# Patient Record
Sex: Female | Born: 1985 | Race: White | Hispanic: No | Marital: Married | Smoking: Former smoker
Health system: Southern US, Community
[De-identification: ages and names within clinical notes are randomized; demographics above are authoritative.]

## PROBLEM LIST (undated history)

## (undated) DIAGNOSIS — N750 Cyst of Bartholin's gland: Secondary | ICD-10-CM

## (undated) HISTORY — DX: Cyst of Bartholin's gland: N75.0

---

## 2008-04-20 ENCOUNTER — Emergency Department (HOSPITAL_COMMUNITY): Admission: EM | Admit: 2008-04-20 | Discharge: 2008-04-20 | Payer: Self-pay | Admitting: Emergency Medicine

## 2009-02-03 ENCOUNTER — Emergency Department (HOSPITAL_COMMUNITY): Admission: EM | Admit: 2009-02-03 | Discharge: 2009-02-03 | Payer: Self-pay | Admitting: Family Medicine

## 2009-09-21 ENCOUNTER — Emergency Department (HOSPITAL_COMMUNITY): Admission: EM | Admit: 2009-09-21 | Discharge: 2009-09-21 | Payer: Self-pay | Admitting: Emergency Medicine

## 2010-03-13 IMAGING — CR DG WRIST COMPLETE 3+V*L*
1 series · 1 of 1 positions shown · non-contrast
Comparison: None.

CLINICAL DATA: Fall, trauma 2 weeks ago snowboarding

LEFT WRIST - COMPLETE 3+ VIEW

[view not recorded]
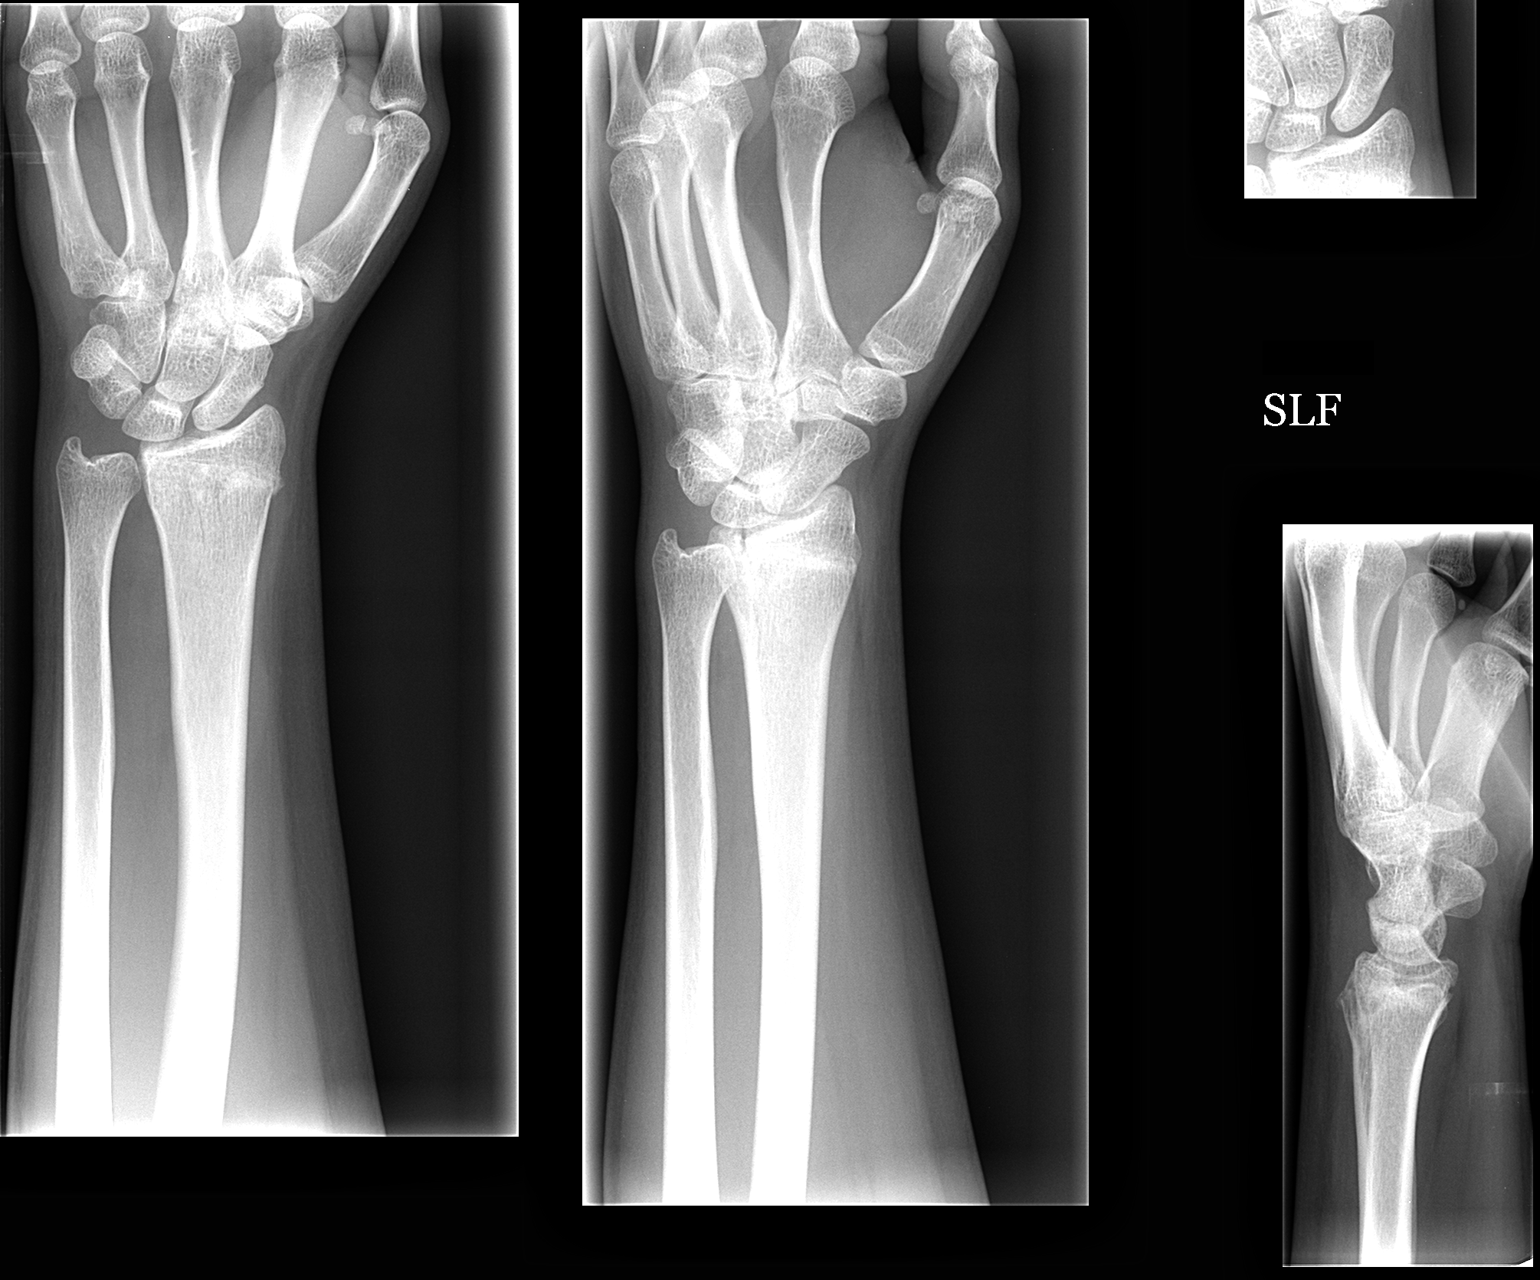

[1 of 1 positions shown; findings below may reference images not displayed]

FINDINGS: A nondisplaced distal radius fracture is noted which
extends to involve the articular surface.  On the frontal view,
there is evidence of early callus formation.  Intact distal ulna
and carpal bones.  On the lateral view, there is mild soft tissue
swelling.
IMPRESSION: Nondisplaced distal radius fracture.

## 2010-05-30 ENCOUNTER — Ambulatory Visit (INDEPENDENT_AMBULATORY_CARE_PROVIDER_SITE_OTHER): Payer: 59 | Admitting: Gynecology

## 2010-05-30 DIAGNOSIS — N750 Cyst of Bartholin's gland: Secondary | ICD-10-CM

## 2017-02-17 ENCOUNTER — Encounter (HOSPITAL_COMMUNITY): Payer: Self-pay | Admitting: Emergency Medicine

## 2017-02-17 ENCOUNTER — Emergency Department (HOSPITAL_COMMUNITY)
Admission: EM | Admit: 2017-02-17 | Discharge: 2017-02-17 | Disposition: A | Discharge status: Home / Self Care | Payer: Self-pay | Attending: Emergency Medicine | Admitting: Emergency Medicine

## 2017-02-17 ENCOUNTER — Other Ambulatory Visit: Payer: Self-pay

## 2017-02-17 DIAGNOSIS — R0981 Nasal congestion: Secondary | ICD-10-CM | POA: Insufficient documentation

## 2017-02-17 DIAGNOSIS — Z5321 Procedure and treatment not carried out due to patient leaving prior to being seen by health care provider: Secondary | ICD-10-CM | POA: Insufficient documentation

## 2017-02-17 NOTE — ED Notes (Signed)
Called patient to go to a room x 3 and no answer. 

## 2017-02-17 NOTE — ED Notes (Signed)
Bed: WTR5 Expected date:  Expected time:  Means of arrival:  Comments: 

## 2017-02-17 NOTE — ED Triage Notes (Addendum)
Patient complaining of a cough and  Nasal congestion. Patient states she has been sick since last Sunday. She got better and has worked three days. She is getting sick again. Patient states her chest is hurting due to all the coughing she is doing.

## 2017-02-25 NOTE — L&D Delivery Note (Signed)
Delivery Note At 7:30 PM a viable female was delivered via Vaginal, Spontaneous (Presentation: vtx; ROP ).  APGAR: 9, 9; weight pending.   Placenta status: spontaneous, intact.  Cord:  with the following complications: none.   Anesthesia: Epidural  Episiotomy: None Lacerations: 1st degree Suture Repair: 3.0 vicryl rapide Est. Blood Loss (mL):  250  Mom to postpartum.  Baby to Couplet care / Skin to Skin.  Will do circumcision in the hospital  Leighton Roachodd D Idali Lafever 01/16/2018, 7:49 PM

## 2017-04-09 ENCOUNTER — Telehealth: Payer: Self-pay | Admitting: *Deleted

## 2017-04-09 ENCOUNTER — Encounter: Payer: Self-pay | Admitting: Women's Health

## 2017-04-09 ENCOUNTER — Ambulatory Visit: Payer: 59 | Admitting: Women's Health

## 2017-04-09 VITALS — BP 118/78 | Ht 64.0 in | Wt 149.0 lb

## 2017-04-09 DIAGNOSIS — N644 Mastodynia: Secondary | ICD-10-CM

## 2017-04-09 NOTE — Telephone Encounter (Signed)
-----   Message from Harrington ChallengerNancy J Young, NP sent at 04/09/2017 12:46 PM EST ----- Needs diagnostic left breast mammogram soreness and tenderness outer aspect for 2 weeks a change, no nipple discharge no injury change in routine or exercise. Tuesdays are best day

## 2017-04-09 NOTE — Telephone Encounter (Signed)
Pt scheduled on 04/15/17 @ 12:40pm pt informed.

## 2017-04-09 NOTE — Patient Instructions (Signed)

## 2017-04-09 NOTE — Progress Notes (Signed)
32 year old MWF G1 P1 with complaint of left breast pain and questionable lump in left outer breast for the past 2 weeks. Has a 33-1/33-year-old daughter who she did breast-feed for 3 years. Denies nipple discharge, erythema, injury, change in routine. Does drink several cups of coffee daily but no change. No family history of breast cancer. Regular monthly cycle/condoms. 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 Recently moved back to the area.  Exam: Appears well. Breast exam and sitting and lying position without visible erythema, retractions, dimpling, palpable nodules, fibroglandular tissue outer aspect of left breast, no nipple discharge, entire outer aspect of left breast tender to touch.  Left breast mastodynia  Plan: We'll schedule diagnostic left breast mammogram. Encouraged to decrease caffeine intake, vitamin E twice daily, keep scheduled annual exam appointment.

## 2017-04-15 ENCOUNTER — Ambulatory Visit
Admission: RE | Admit: 2017-04-15 | Discharge: 2017-04-15 | Disposition: A | Discharge status: Home / Self Care | Payer: 59 | Source: Ambulatory Visit | Attending: Women's Health | Admitting: Women's Health

## 2017-04-15 DIAGNOSIS — N644 Mastodynia: Secondary | ICD-10-CM

## 2017-05-07 ENCOUNTER — Encounter: Payer: Self-pay | Admitting: Women's Health

## 2017-06-26 LAB — OB RESULTS CONSOLE HEPATITIS B SURFACE ANTIGEN: Hepatitis B Surface Ag: NEGATIVE

## 2017-06-26 LAB — OB RESULTS CONSOLE RPR: RPR: NONREACTIVE

## 2017-06-26 LAB — OB RESULTS CONSOLE HIV ANTIBODY (ROUTINE TESTING): HIV: NONREACTIVE

## 2017-06-26 LAB — OB RESULTS CONSOLE GC/CHLAMYDIA
Chlamydia: NEGATIVE
GC PROBE AMP, GENITAL: NEGATIVE

## 2017-06-26 LAB — OB RESULTS CONSOLE ABO/RH: RH Type: POSITIVE

## 2017-06-26 LAB — OB RESULTS CONSOLE ANTIBODY SCREEN: ANTIBODY SCREEN: NEGATIVE

## 2017-06-26 LAB — OB RESULTS CONSOLE RUBELLA ANTIBODY, IGM: Rubella: IMMUNE

## 2017-07-15 ENCOUNTER — Encounter: Payer: Self-pay | Admitting: Women's Health

## 2017-12-24 LAB — OB RESULTS CONSOLE GBS: STREP GROUP B AG: POSITIVE

## 2018-01-07 ENCOUNTER — Telehealth (HOSPITAL_COMMUNITY): Payer: Self-pay | Admitting: *Deleted

## 2018-01-07 ENCOUNTER — Encounter (HOSPITAL_COMMUNITY): Payer: Self-pay | Admitting: *Deleted

## 2018-01-07 NOTE — Telephone Encounter (Signed)
Preadmission screen  

## 2018-01-16 ENCOUNTER — Inpatient Hospital Stay (HOSPITAL_COMMUNITY)
Admission: RE | Admit: 2018-01-16 | Discharge: 2018-01-18 | DRG: 806 | Disposition: A | Discharge status: Home / Self Care | Payer: 59 | Attending: Obstetrics and Gynecology | Admitting: Obstetrics and Gynecology

## 2018-01-16 ENCOUNTER — Inpatient Hospital Stay (HOSPITAL_COMMUNITY): Payer: 59 | Admitting: Anesthesiology

## 2018-01-16 ENCOUNTER — Encounter (HOSPITAL_COMMUNITY): Payer: Self-pay

## 2018-01-16 DIAGNOSIS — O9832 Other infections with a predominantly sexual mode of transmission complicating childbirth: Secondary | ICD-10-CM | POA: Diagnosis present

## 2018-01-16 DIAGNOSIS — A6000 Herpesviral infection of urogenital system, unspecified: Secondary | ICD-10-CM | POA: Diagnosis present

## 2018-01-16 DIAGNOSIS — Z3A39 39 weeks gestation of pregnancy: Secondary | ICD-10-CM | POA: Diagnosis not present

## 2018-01-16 DIAGNOSIS — Z3483 Encounter for supervision of other normal pregnancy, third trimester: Secondary | ICD-10-CM | POA: Diagnosis present

## 2018-01-16 DIAGNOSIS — Z87891 Personal history of nicotine dependence: Secondary | ICD-10-CM

## 2018-01-16 DIAGNOSIS — O99824 Streptococcus B carrier state complicating childbirth: Principal | ICD-10-CM | POA: Diagnosis present

## 2018-01-16 LAB — TYPE AND SCREEN
ABO/RH(D): A POS
ANTIBODY SCREEN: NEGATIVE

## 2018-01-16 LAB — CBC
HEMATOCRIT: 37.8 % (ref 36.0–46.0)
Hemoglobin: 12.8 g/dL (ref 12.0–15.0)
MCH: 30.5 pg (ref 26.0–34.0)
MCHC: 33.9 g/dL (ref 30.0–36.0)
MCV: 90.2 fL (ref 80.0–100.0)
NRBC: 0 % (ref 0.0–0.2)
PLATELETS: 179 10*3/uL (ref 150–400)
RBC: 4.19 MIL/uL (ref 3.87–5.11)
RDW: 12.5 % (ref 11.5–15.5)
WBC: 8.2 10*3/uL (ref 4.0–10.5)

## 2018-01-16 LAB — ABO/RH: ABO/RH(D): A POS

## 2018-01-16 LAB — RPR: RPR: NONREACTIVE

## 2018-01-16 MED ORDER — MEASLES, MUMPS & RUBELLA VAC IJ SOLR
0.5000 mL | Freq: Once | INTRAMUSCULAR | Status: DC
  Filled 2018-01-16: qty 0.5

## 2018-01-16 MED ORDER — OXYCODONE-ACETAMINOPHEN 5-325 MG PO TABS: 2.0000 | ORAL_TABLET | ORAL | Status: DC | PRN

## 2018-01-16 MED ORDER — ZOLPIDEM TARTRATE 5 MG PO TABS: 5.0000 mg | ORAL_TABLET | Freq: Every evening | ORAL | Status: DC | PRN

## 2018-01-16 MED ORDER — SENNOSIDES-DOCUSATE SODIUM 8.6-50 MG PO TABS
2.0000 | ORAL_TABLET | ORAL | Status: DC
  Administered 2018-01-16 – 2018-01-18 (×2): 2 via ORAL
  Filled 2018-01-16 (×2): qty 2

## 2018-01-16 MED ORDER — ONDANSETRON HCL 4 MG/2ML IJ SOLN: 4.0000 mg | Freq: Four times a day (QID) | INTRAMUSCULAR | Status: DC | PRN

## 2018-01-16 MED ORDER — MAGNESIUM HYDROXIDE 400 MG/5ML PO SUSP: 30.0000 mL | ORAL | Status: DC | PRN

## 2018-01-16 MED ORDER — COCONUT OIL OIL: 1.0000 "application " | TOPICAL_OIL | Status: DC | PRN

## 2018-01-16 MED ORDER — METHYLERGONOVINE MALEATE 0.2 MG/ML IJ SOLN: 0.2000 mg | INTRAMUSCULAR | Status: DC | PRN

## 2018-01-16 MED ORDER — OXYCODONE HCL 5 MG PO TABS: 5.0000 mg | ORAL_TABLET | ORAL | Status: DC | PRN

## 2018-01-16 MED ORDER — PENICILLIN G 3 MILLION UNITS IVPB - SIMPLE MED
3.0000 10*6.[IU] | INTRAVENOUS | Status: DC
  Administered 2018-01-16: 3 10*6.[IU] via INTRAVENOUS
  Filled 2018-01-16 (×3): qty 100

## 2018-01-16 MED ORDER — SIMETHICONE 80 MG PO CHEW: 80.0000 mg | CHEWABLE_TABLET | ORAL | Status: DC | PRN

## 2018-01-16 MED ORDER — BENZOCAINE-MENTHOL 20-0.5 % EX AERO
1.0000 "application " | INHALATION_SPRAY | CUTANEOUS | Status: DC | PRN
  Filled 2018-01-16: qty 56

## 2018-01-16 MED ORDER — PRENATAL MULTIVITAMIN CH
1.0000 | ORAL_TABLET | Freq: Every day | ORAL | Status: DC
  Administered 2018-01-17 – 2018-01-18 (×2): 1 via ORAL
  Filled 2018-01-16 (×2): qty 1

## 2018-01-16 MED ORDER — EPHEDRINE 5 MG/ML INJ: 10.0000 mg | INTRAVENOUS | Status: DC | PRN

## 2018-01-16 MED ORDER — EPHEDRINE 5 MG/ML INJ
10.0000 mg | INTRAVENOUS | Status: DC | PRN
  Filled 2018-01-16: qty 2

## 2018-01-16 MED ORDER — LIDOCAINE HCL (PF) 1 % IJ SOLN
30.0000 mL | INTRAMUSCULAR | Status: DC | PRN
  Filled 2018-01-16: qty 30

## 2018-01-16 MED ORDER — TERBUTALINE SULFATE 1 MG/ML IJ SOLN
0.2500 mg | Freq: Once | INTRAMUSCULAR | Status: DC | PRN
  Filled 2018-01-16: qty 1

## 2018-01-16 MED ORDER — ONDANSETRON HCL 4 MG PO TABS: 4.0000 mg | ORAL_TABLET | ORAL | Status: DC | PRN

## 2018-01-16 MED ORDER — ACETAMINOPHEN 325 MG PO TABS: 650.0000 mg | ORAL_TABLET | ORAL | Status: DC | PRN

## 2018-01-16 MED ORDER — PHENYLEPHRINE 40 MCG/ML (10ML) SYRINGE FOR IV PUSH (FOR BLOOD PRESSURE SUPPORT)
80.0000 ug | PREFILLED_SYRINGE | INTRAVENOUS | Status: DC | PRN
  Filled 2018-01-16: qty 5

## 2018-01-16 MED ORDER — LACTATED RINGERS IV SOLN: 500.0000 mL | Freq: Once | INTRAVENOUS | Status: DC

## 2018-01-16 MED ORDER — SOD CITRATE-CITRIC ACID 500-334 MG/5ML PO SOLN: 30.0000 mL | ORAL | Status: DC | PRN

## 2018-01-16 MED ORDER — FENTANYL 2.5 MCG/ML BUPIVACAINE 1/10 % EPIDURAL INFUSION (WH - ANES)
14.0000 mL/h | INTRAMUSCULAR | Status: DC | PRN
  Administered 2018-01-16: 14 mL/h via EPIDURAL
  Filled 2018-01-16 (×2): qty 100

## 2018-01-16 MED ORDER — LACTATED RINGERS IV SOLN: 500.0000 mL | INTRAVENOUS | Status: DC | PRN

## 2018-01-16 MED ORDER — SODIUM CHLORIDE 0.9 % IV SOLN
5.0000 10*6.[IU] | Freq: Once | INTRAVENOUS | Status: AC
  Administered 2018-01-16: 5 10*6.[IU] via INTRAVENOUS
  Filled 2018-01-16: qty 5

## 2018-01-16 MED ORDER — OXYTOCIN BOLUS FROM INFUSION
500.0000 mL | Freq: Once | INTRAVENOUS | Status: AC
  Administered 2018-01-16: 500 mL via INTRAVENOUS

## 2018-01-16 MED ORDER — ONDANSETRON HCL 4 MG/2ML IJ SOLN: 4.0000 mg | INTRAMUSCULAR | Status: DC | PRN

## 2018-01-16 MED ORDER — IBUPROFEN 600 MG PO TABS
600.0000 mg | ORAL_TABLET | Freq: Four times a day (QID) | ORAL | Status: DC
  Administered 2018-01-16 – 2018-01-18 (×7): 600 mg via ORAL
  Filled 2018-01-16 (×7): qty 1

## 2018-01-16 MED ORDER — LIDOCAINE HCL (PF) 1 % IJ SOLN
INTRAMUSCULAR | Status: DC | PRN
  Administered 2018-01-16: 5 mL via EPIDURAL

## 2018-01-16 MED ORDER — OXYTOCIN 40 UNITS IN LACTATED RINGERS INFUSION - SIMPLE MED: 1.0000 m[IU]/min | INTRAVENOUS | Status: DC

## 2018-01-16 MED ORDER — LACTATED RINGERS IV SOLN
INTRAVENOUS | Status: DC
  Administered 2018-01-16: 1000 mL via INTRAVENOUS
  Administered 2018-01-16: 08:00:00 via INTRAVENOUS

## 2018-01-16 MED ORDER — OXYTOCIN 40 UNITS IN LACTATED RINGERS INFUSION - SIMPLE MED
1.0000 m[IU]/min | INTRAVENOUS | Status: DC
  Administered 2018-01-16: 1 m[IU]/min via INTRAVENOUS
  Filled 2018-01-16: qty 1000

## 2018-01-16 MED ORDER — BUTORPHANOL TARTRATE 1 MG/ML IJ SOLN: 1.0000 mg | INTRAMUSCULAR | Status: DC | PRN

## 2018-01-16 MED ORDER — DIPHENHYDRAMINE HCL 50 MG/ML IJ SOLN: 12.5000 mg | INTRAMUSCULAR | Status: DC | PRN

## 2018-01-16 MED ORDER — PHENYLEPHRINE 40 MCG/ML (10ML) SYRINGE FOR IV PUSH (FOR BLOOD PRESSURE SUPPORT)
80.0000 ug | PREFILLED_SYRINGE | INTRAVENOUS | Status: DC | PRN
  Filled 2018-01-16: qty 5
  Filled 2018-01-16: qty 10

## 2018-01-16 MED ORDER — OXYTOCIN 40 UNITS IN LACTATED RINGERS INFUSION - SIMPLE MED: 2.5000 [IU]/h | INTRAVENOUS | Status: DC

## 2018-01-16 MED ORDER — ACETAMINOPHEN 325 MG PO TABS
650.0000 mg | ORAL_TABLET | ORAL | Status: DC | PRN
  Administered 2018-01-17: 650 mg via ORAL
  Filled 2018-01-16: qty 2

## 2018-01-16 MED ORDER — METHYLERGONOVINE MALEATE 0.2 MG PO TABS: 0.2000 mg | ORAL_TABLET | ORAL | Status: DC | PRN

## 2018-01-16 MED ORDER — OXYCODONE HCL 5 MG PO TABS: 10.0000 mg | ORAL_TABLET | ORAL | Status: DC | PRN

## 2018-01-16 MED ORDER — DIPHENHYDRAMINE HCL 25 MG PO CAPS: 25.0000 mg | ORAL_CAPSULE | Freq: Four times a day (QID) | ORAL | Status: DC | PRN

## 2018-01-16 MED ORDER — WITCH HAZEL-GLYCERIN EX PADS: 1.0000 "application " | MEDICATED_PAD | CUTANEOUS | Status: DC | PRN

## 2018-01-16 MED ORDER — PHENYLEPHRINE 40 MCG/ML (10ML) SYRINGE FOR IV PUSH (FOR BLOOD PRESSURE SUPPORT): 80.0000 ug | PREFILLED_SYRINGE | INTRAVENOUS | Status: DC | PRN

## 2018-01-16 MED ORDER — DIBUCAINE 1 % RE OINT: 1.0000 "application " | TOPICAL_OINTMENT | RECTAL | Status: DC | PRN

## 2018-01-16 MED ORDER — OXYCODONE-ACETAMINOPHEN 5-325 MG PO TABS: 1.0000 | ORAL_TABLET | ORAL | Status: DC | PRN

## 2018-01-16 MED ORDER — TETANUS-DIPHTH-ACELL PERTUSSIS 5-2.5-18.5 LF-MCG/0.5 IM SUSP: 0.5000 mL | Freq: Once | INTRAMUSCULAR | Status: DC

## 2018-01-16 NOTE — Progress Notes (Signed)
Charted on wrong pt.

## 2018-01-16 NOTE — Progress Notes (Signed)
Comfortable with epidural Afeb, VSS FHT- 110-120, Cat I VE-2/70/-2, vtx, AROM clear Continue PCN and pitocin, monitor progress, anticipate SVD

## 2018-01-16 NOTE — Progress Notes (Signed)
Comfortable with epidural Afeb, VSS FHT- 110-120, some early decels, Cat I VE-6 cm per RN Continue PCN and pitocin, anticipate SVD

## 2018-01-16 NOTE — Progress Notes (Signed)
NICU called

## 2018-01-16 NOTE — H&P (Signed)
Alyssa Gross is a 32 y.o. female, G2 P1001, EGA 39+ [facility]els [facility] presenting for elective induction.  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  OB History    Gravida  2   Para  1   Term      Preterm      AB  0   Living  1     SAB      TAB      Ectopic  0   Multiple      Live Births  1          Past Medical History:  Diagnosis Date  . Bartholin's duct cyst    abcess treated with word catheter   History reviewed. No pertinent surgical history. Family History: family history includes Alzheimer's disease in her paternal grandfather; Asthma in her mother; Breast cancer (age of onset: 4335) in her mother; Esophageal cancer in her maternal grandfather; Hypertension in her father; Lung cancer in her maternal grandmother and paternal grandmother. Social History:  reports that she quit smoking about 10 months ago. She has never used smokeless tobacco. She reports that she drinks alcohol. She reports that she does not use drugs.     Maternal Diabetes: No Genetic Screening: Declined Maternal Ultrasounds/Referrals: Normal Fetal Ultrasounds or other Referrals:  None Maternal Substance Abuse:  No Significant Maternal Medications:  None Significant Maternal Lab Results:  Lab values include: Group B Strep positive Other Comments:  None  Review of Systems  Respiratory: Negative.   Cardiovascular: Negative.    Maternal Medical History:  Fetal activity: Perceived fetal activity is normal.    Prenatal complications: no prenatal complications Prenatal Complications - Diabetes: none.    Dilation: 1.5 Effacement (%): 50 Station: -2 Exam by:: Alyssa Gross Blood pressure 117/78, pulse (!) 104, temperature 98.1 F (36.7 C), temperature source Oral, height 5\' 4"  (1.626 m), weight 81.6 kg, last menstrual period 03/26/2017. Maternal Exam:  Uterine Assessment: Contraction strength is mild.  Contraction frequency is irregular.    Abdomen: Patient reports no abdominal tenderness. Estimated fetal weight is 7.5 lbs.   Fetal presentation: vertex  Introitus: Normal vulva. Normal vagina.  Amniotic fluid character: not assessed.  Pelvis: adequate for delivery.   Cervix: Cervix evaluated by digital exam.     Fetal Exam Fetal Monitor Review: Mode: ultrasound.   Baseline rate: 130.  Variability: moderate (6-25 bpm).   Pattern: accelerations present and no decelerations.    Fetal State Assessment: Category I - tracings are normal.     Physical Exam  Prenatal labs: ABO, Rh: A/Positive/-- (05/02 0000) Antibody: Negative (05/02 0000) Rubella: Immune (05/02 0000) RPR: Nonreactive (05/02 0000)  HBsAg: Negative (05/02 0000)  HIV: Non-reactive (05/02 0000)  GBS: Positive (10/30 0000)   Assessment/Plan: IUP at 39+ weeks for elective induction, h/o rapid labor, +GBS.  Will start PCN, then pitocin, epidural when having ctx, monitor progress, anticipate SVD   Leighton Roachodd D Helmut Hennon 01/16/2018, 8:06 AM

## 2018-01-16 NOTE — Anesthesia Pain Management Evaluation Note (Signed)
  CRNA Pain Management Visit Note  Patient: Alyssa Gross, 32 y.o., female  "Hello [facility] am a [facility] the [facility]. We have an anesthesia team available at all times to provide care throughout the hospital, including epidural management and anesthesia for C-section. I don't know your plan for the delivery whether it a natural birth, water birth, IV sedation, nitrous supplementation, doula or epidural, but we want to meet your pain goals."   1.Was your pain managed to your expectations on prior hospitalizations?   Yes   2.What is your expectation for pain management during this hospitalization?     Epidural  3.How can we help you reach that goal? epidural  Record the patient's initial score and the patient's pain goal.   Pain: 3  Pain Goal: 5 [facility] wants you to be able to say your pain was always managed very well.  [name] 01/16/2018

## 2018-01-16 NOTE — Anesthesia Procedure Notes (Signed)
Epidural Patient location during procedure: OB Start time: 01/16/2018 11:04 AM End time: 01/16/2018 11:22 AM  Staffing Anesthesiologist: Trevor IhaHouser, Kalene Cutler A, MD Performed: anesthesiologist   Preanesthetic Checklist Completed: patient identified, site marked, surgical consent, pre-op evaluation, timeout performed, IV checked, risks and benefits discussed and monitors and equipment checked  Epidural Patient position: sitting Prep: site prepped and draped and DuraPrep Patient monitoring: continuous pulse ox and blood pressure Approach: midline Location: L3-L4 Injection technique: LOR air  Needle:  Needle type: Tuohy  Needle gauge: 17 G Needle length: 9 cm and 9 Needle insertion depth: 8 cm Catheter type: closed end flexible Catheter size: 19 Gauge Catheter at skin depth: 13 cm Test dose: negative  Assessment Events: blood not aspirated, injection not painful, no injection resistance, negative IV test and no paresthesia  Additional Notes 1 attempts. Pt tolerated procedure well

## 2018-01-16 NOTE — Anesthesia Preprocedure Evaluation (Addendum)
Anesthesia Evaluation  Patient identified by MRN, date of birth, ID band Patient awake    Reviewed: Allergy & Precautions, H&P , NPO status , Patient's Chart, lab work & pertinent test results  Airway Mallampati: II  TM Distance: >3 FB Neck ROM: Full    Dental no notable dental hx. (+) Teeth Intact   Pulmonary former smoker,    Pulmonary exam normal breath sounds clear to auscultation       Cardiovascular negative cardio ROS Normal cardiovascular exam Rhythm:Regular Rate:Normal     Neuro/Psych negative neurological ROS  negative psych ROS   GI/Hepatic negative GI ROS, Neg liver ROS,   Endo/Other  negative endocrine ROS  Renal/GU negative Renal ROS     Musculoskeletal negative musculoskeletal ROS (+)   Abdominal   Peds  Hematology negative hematology ROS (+)   Anesthesia Other Findings   Reproductive/Obstetrics (+) Pregnancy                             Lab Results  Component Value Date   WBC 8.2 01/16/2018   HGB 12.8 01/16/2018   HCT 37.8 01/16/2018   MCV 90.2 01/16/2018   PLT 179 01/16/2018    Anesthesia Physical Anesthesia Plan  ASA: II  Anesthesia Plan: Epidural   Post-op Pain Management:    Induction:   PONV Risk Score and Plan:   Airway Management Planned:   Additional Equipment:   Intra-op Plan:   Post-operative Plan:   Informed Consent: I have reviewed the patients History and Physical, chart, labs and discussed the procedure including the risks, benefits and alternatives for the proposed anesthesia with the patient or authorized representative who has indicated his/her understanding and acceptance.   Dental advisory given  Plan Discussed with: CRNA  Anesthesia Plan Comments:         Anesthesia Quick Evaluation

## 2018-01-16 NOTE — Progress Notes (Signed)
This note also relates to the following rows which could not be included: BP - Cannot attach notes to unvalidated device data Pulse Rate - Cannot attach notes to unvalidated device data  IV Line leaking. Replaced

## 2018-01-17 ENCOUNTER — Other Ambulatory Visit: Payer: Self-pay

## 2018-01-17 MED ORDER — IBUPROFEN 600 MG PO TABS: 600.0000 mg | ORAL_TABLET | Freq: Four times a day (QID) | ORAL | 0 refills | Status: DC

## 2018-01-17 NOTE — Discharge Instructions (Signed)
As per discharge pamphlet °

## 2018-01-17 NOTE — Progress Notes (Signed)
PPD #1 No problems, wants to go home Afeb, VSS Fundus firm, NT at U-1 Continue routine postpartum care, d/c home this pm unless problems today

## 2018-01-17 NOTE — Progress Notes (Signed)
MOB was referred for history of depression/anxiety. * Referral screened out by Clinical Social Worker because none of the following criteria appear to apply: ~ History of anxiety/depression during this pregnancy, or of post-partum depression following prior delivery. ~ Diagnosis of anxiety and/or depression within last 3 years OR * MOB's symptoms currently being treated with medication and/or therapy. MOB has an active Rx for Zoloft. No concerns noted in OB records.   Please contact the Clinical Social Worker if needs arise, by MOB request, or if MOB scores greater than 9/yes to question 10 on Edinburgh Postpartum Depression Screen.  Donnita Farina Boyd-Gilyard, MSW, LCSW Clinical Social Work [phone]  

## 2018-01-17 NOTE — Anesthesia Postprocedure Evaluation (Signed)
Anesthesia Post Note  Patient: Alyssa Gross  Procedure(s) Performed: AN AD HOC LABOR EPIDURAL     Patient location during evaluation: Mother Baby Anesthesia Type: Epidural Level of consciousness: awake and alert Pain management: satisfactory to patient Vital Signs Assessment: post-procedure vital signs reviewed and stable Respiratory status: spontaneous breathing Cardiovascular status: stable Postop Assessment: no headache, epidural receding, patient able to bend at knees and no apparent nausea or vomiting Anesthetic complications: no Comments: Pain score 4.      Last Vitals:  Vitals:   01/17/18 0627 01/17/18 0936  BP: 104/64 117/74  Pulse: 77 68  Resp: 16 16  Temp: 36.5 C 36.7 C  SpO2: 98%     Last Pain:  Vitals:   01/17/18 0936  TempSrc: Oral  PainSc:    Pain Goal:                 [facility] Hospital[facility],Alyssa Gross

## 2018-01-17 NOTE — Lactation Note (Signed)
This note was copied from a baby's chart. Lactation Consultation Note  Patient Name: Alyssa Gross ZOXWR'UToday's Date: 01/17/2018 Reason for consult: Initial assessment;Term  8816 hours old FT female who is being exclusively BF by his mother she's a P2 and experienced BF. She was able to BF her first child for 36 months. Mom has a Medela DEBP at home. Mom requested an early discharge, but she may not go home until tomorrow.   Baby was was asleep when entering the room, offered assistance with latch but mom politely declined stating that baby just fed. Asked mom to call for latch assistance when needed. Per mom feedings at the breast are comfortable, she hasn't heard baby swallowing but she noticed some colostrum on baby's spit up. Noticed that baby didn't have his hat on, reminded parents the importance of keeping baby's core temperature during the first 24 hours.  Feeding plan:  1. Encouraged mom to feed baby STS 8-12 times/24 hours or sooner if feeding cues are present 2. Mom won't be doing any hand expression, she'd rather pump instead, but not ready to do it while at the hospital.  BF brochure, BF resources and feeding diary were reviewed. Parents reported all questions and concerns were answered, they're both aware of LC services and will call PRN.    Maternal Data Formula Feeding for Exclusion: No Has patient been taught Hand Expression?: No(mom doesn't feel comfortable doing hand expression) Does the patient have breastfeeding experience prior to this delivery?: Yes  Feeding Feeding Type: Breast Fed  LATCH Score Latch: Grasps breast easily, tongue down, lips flanged, rhythmical sucking.  Audible Swallowing: A few with stimulation  Type of Nipple: Everted at rest and after stimulation  Comfort (Breast/Nipple): Soft / non-tender  Hold (Positioning): No assistance needed to correctly position infant at breast.  LATCH Score: 9  Interventions Interventions: Breast feeding  basics reviewed  Lactation Tools Discussed/Used WIC Program: No   Consult Status Consult Status: Follow-up Date: 01/18/18 Follow-up type: In-patient    Alyssa Gross 01/17/2018, 12:03 PM

## 2018-01-17 NOTE — Discharge Summary (Signed)
OB Discharge Summary     Patient Name: Alyssa Gross DOB: 12-18-1985 MRN: [id number]  Date of admission: 01/16/2018 Delivering MD: Jackelyn Knife, Tidus Upchurch   Date of discharge: 01/18/2018  Admitting diagnosis: 39WKS INDUCTION  Intrauterine pregnancy: [redacted]w[redacted]d     Secondary diagnosis:  Active Problems:   Indication for care in labor or delivery   SVD (spontaneous vaginal delivery)      Discharge diagnosis: Term Pregnancy Delivered                                                                                                Hospital course:  Induction of Labor With Vaginal Delivery   32 y.o. yo G2P1002 at [redacted]w[redacted]d was admitted to the hospital 01/16/2018 for induction of labor.  Indication for induction: Favorable cervix at term.  Patient had an uncomplicated labor course as follows: Membrane Rupture Time/Date: 12:09 PM ,01/16/2018   Intrapartum Procedures: Episiotomy: None [1]                                         Lacerations:  1st degree [2]  Patient had delivery of a Viable infant.  Information for the patient's newborn:  Genisis, Sonnier [[id number]]  Delivery Method: Vaginal, Spontaneous(Filed from Delivery Summary)   01/16/2018  Details of delivery can be found in separate delivery note.  Patient had a routine postpartum course. Patient is discharged home 01/17/18.  Physical exam  Vitals:   01/16/18 2130 01/16/18 2227 01/17/18 0228 01/17/18 0627  BP: 120/75 114/80 110/75 104/64  Pulse: 78 92 66 77  Resp: 16 18  16   Temp: 98 F (36.7 C)  97.9 F (36.6 C) 97.7 F (36.5 C)  TempSrc: Oral  Oral Oral  SpO2:  98% 99% 98%  Weight:      Height:       General: alert Lochia: appropriate Uterine Fundus: firm  Labs: Lab Results  Component Value Date   WBC 8.2 01/16/2018   HGB 12.8 01/16/2018   HCT 37.8 01/16/2018   MCV 90.2 01/16/2018   PLT 179 01/16/2018   No flowsheet data found.  Discharge instruction: per After Visit Summary and "Baby and Me Booklet".  After  visit meds:  Allergies as of 01/17/2018      Reactions   Tetracyclines & Related Anaphylaxis      Medication List    STOP taking these medications   valACYclovir 500 MG tablet Commonly known as:  VALTREX     TAKE these medications   acetaminophen 325 MG tablet Commonly known as:  TYLENOL Take 650 mg by mouth every 6 (six) hours as needed.   calcium carbonate 500 MG chewable tablet Commonly known as:  TUMS - dosed in mg elemental calcium Chew 1 tablet by mouth daily.   ibuprofen 600 MG tablet Commonly known as:  ADVIL,MOTRIN Take 1 tablet (600 mg total) by mouth every 6 (six) hours.   prenatal multivitamin Tabs tablet Take 1 tablet by mouth daily at 12 noon.  Diet: routine diet  Activity: Advance as tolerated. Pelvic rest for 6 weeks.   Outpatient follow up:6 weeks  Newborn Data: Live born female  Birth Weight: 7 lb 10.2 oz (3465 g) APGAR: 9, 9  Newborn Delivery   Birth date/time:  01/16/2018 19:30:00 Delivery type:  Vaginal, Spontaneous     Baby Feeding: Breast Disposition:home with mother

## 2018-01-18 NOTE — Progress Notes (Signed)
PPD #2 No problems, stayed yesterday due to baby issues Afeb, VSS D/c home

## 2018-01-18 NOTE — Lactation Note (Signed)
This note was copied from a baby's chart. Lactation Consultation Note  Patient Name: Alyssa Gross BJYNW'GToday's Date: 01/18/2018 Reason for consult: Follow-up assessment;Term P2, 35 hour female infant. Mom will previous BF experience mom breastfeed her daughter for 3 years. Mom feels confident with breastfeeding per mom, breastfeeding is going well. Infant was cuing and mom latched infant on right breast in side lying position.  Infant had wide gape and audible swallowing observed no assistance was needed with latch. LC discussed engorgement prevent and treatment.    Maternal Data    Feeding Feeding Type: Breast Fed  LATCH Score Latch: Grasps breast easily, tongue down, lips flanged, rhythmical sucking.  Audible Swallowing: Spontaneous and intermittent  Type of Nipple: Everted at rest and after stimulation  Comfort (Breast/Nipple): Soft / non-tender  Hold (Positioning): No assistance needed to correctly position infant at breast.  LATCH Score: 10  Interventions Interventions: Skin to skin  Lactation Tools Discussed/Used     Consult Status Consult Status: Complete Date: 01/18/18    Alyssa Gross 01/18/2018, 6:44 AM

## 2018-01-22 ENCOUNTER — Inpatient Hospital Stay (HOSPITAL_COMMUNITY): Admission: AD | Admit: 2018-01-22 | Payer: 59 | Source: Ambulatory Visit | Admitting: Obstetrics and Gynecology

## 2019-05-06 ENCOUNTER — Encounter: Payer: Self-pay | Admitting: Cardiology

## 2019-05-06 ENCOUNTER — Ambulatory Visit: Payer: 59 | Admitting: Cardiology

## 2019-05-06 ENCOUNTER — Other Ambulatory Visit: Payer: Self-pay

## 2019-05-06 VITALS — BP 122/62 | HR 67 | Temp 97.4°F | Ht 64.0 in | Wt 168.8 lb

## 2019-05-06 DIAGNOSIS — R079 Chest pain, unspecified: Secondary | ICD-10-CM

## 2019-05-06 NOTE — Patient Instructions (Signed)
Medication Instructions:  Your physician recommends that you continue on your current medications as directed. Please refer to the Current Medication list given to you today.  Lab Work: NONE  Testing/Procedures: NONE  Follow-Up: At [facility], you and your health needs are our priority.  As part of our continuing mission to provide you with exceptional heart care, we have created designated Provider Care Teams.  These Care Teams include your primary Cardiologist (physician) and Advanced Practice Providers (APPs -  Physician Assistants and Nurse Practitioners) who all work together to provide you with the care you need, when you need it.  We recommend signing up for the patient portal called "MyChart".  Sign up information is provided on this After Visit Summary.  MyChart is used to connect with patients for Virtual Visits (Telemedicine).  Patients are able to view lab/test results, encounter notes, upcoming appointments, etc.  Non-urgent messages can be sent to your provider as well.   To learn more about what you can do with MyChart, go to [email or web address].    Your next appointment:   As needed with Dr. Schumann    

## 2019-05-06 NOTE — Progress Notes (Signed)
Cardiology Office Note:    Date:  05/06/2019   ID:  Alyssa Gross, DOB 04-Apr-1985, MRN [id number]  PCP:  Patient, No Pcp Per  Cardiologist:  No primary care provider on file.  Electrophysiologist:  None   Referring MD: No ref. provider found   Chief Complaint  Patient presents with  . Chest Pain    History of Present Illness:    Alyssa Gross is a 34 y.o. female with no past medical history who presents today for evaluation of chest pain.  She reports that chest pain started this week.  She reports having pain for 3 days in a row.  Describes pain as on the left side of her chest, 5 out of 10 in intensity.  Described as sharp stabbing pain.  Lasts for couple seconds and resolves.  No relationship with exertion.  States that she will walk for 1 to 2 miles, denies any chest pain or dyspnea.  Denies any lightheadedness, syncope, or palpitations.  She smoked half a pack per day on and off for 10 years, but quit 5 years ago.  No history of heart disease in her immediate family.   Past Medical History:  Diagnosis Date  . Bartholin's duct cyst    abcess treated with word catheter    History reviewed. No pertinent surgical history.  Current Medications: Current Meds  Medication Sig  . acetaminophen (TYLENOL) 325 MG tablet Take 650 mg by mouth every 6 (six) hours as needed.     Allergies:   Tetracyclines & related   Social History   Socioeconomic History  . Marital status: Married    Spouse name: Not on file  . Number of children: Not on file  . Years of education: Not on file  . Highest education level: Not on file  Occupational History  . Not on file  Tobacco Use  . Smoking status: Former Smoker    Quit date: 03/05/2017    Years since quitting: 2.1  . Smokeless tobacco: Never Used  Substance and Sexual Activity  . Alcohol use: Yes  . Drug use: No  . Sexual activity: Yes    Comment: intercourse age 13, more than 5 sexual partners  Other Topics Concern  . Not on  file  Social History Narrative  . Not on file   Social Determinants of Health   Financial Resource Strain:   . Difficulty of Paying Living Expenses:   Food Insecurity:   . Worried About Charity fundraiser in the Last Year:   . Arboriculturist in the Last Year:   Transportation Needs:   . Film/video editor (Medical):   Marland Kitchen Lack of Transportation (Non-Medical):   Physical Activity:   . Days of Exercise per Week:   . Minutes of Exercise per Session:   Stress:   . Feeling of Stress :   Social Connections:   . Frequency of Communication with Friends and Family:   . Frequency of Social Gatherings with Friends and Family:   . Attends Religious Services:   . Active Member of Clubs or Organizations:   . Attends Archivist Meetings:   Marland Kitchen Marital Status:      Family History: The patient's family history includes Alzheimer's disease in her paternal grandfather; Asthma in her mother; Breast cancer (age of onset: 29) in her mother; Esophageal cancer in her maternal grandfather; Hypertension in her father; Lung cancer in her maternal grandmother and paternal grandmother.  ROS:   Please  see the history of present illness.     All other systems reviewed and are negative.  EKGs/Labs/Other Studies Reviewed:    The following studies were reviewed today:   EKG:  EKG is  ordered today.  The ekg ordered today demonstrates normal sinus rhythm, rate 67, no ST/T abnormalities  Recent Labs: No results found for requested labs within last 8760 hours.  Recent Lipid Panel No results found for: CHOL, TRIG, HDL, CHOLHDL, VLDL, LDLCALC, LDLDIRECT  Physical Exam:    VS:  BP 122/62   Pulse 67   Temp (!) 97.4 F (36.3 C)   Ht 5\' 4"  (1.626 m)   Wt 168 lb 12.8 oz (76.6 kg)   SpO2 99%   BMI 28.97 kg/m     Wt Readings from Last 3 Encounters:  05/06/19 168 lb 12.8 oz (76.6 kg)  01/16/18 180 lb (81.6 kg)  04/09/17 149 lb (67.6 kg)     GEN:  Well nourished, well developed in no  acute distress HEENT: Normal NECK: No JVD; No carotid bruits CARDIAC: RRR, no murmurs, rubs, gallops RESPIRATORY:  Clear to auscultation without rales, wheezing or rhonchi  ABDOMEN: Soft, non-tender, non-distended MUSCULOSKELETAL:  No edema; No deformity  SKIN: Warm and dry NEUROLOGIC:  Alert and oriented x 3 PSYCHIATRIC:  Normal affect   ASSESSMENT:    1. Chest pain of uncertain etiology    PLAN:    In order of problems listed above:  Chest pain: Description suggests noncardiac chest pain, as describes sharp left-sided pain that lasts a few seconds and resolves; no relationship to exertion.  Given age and lack of risk factors, no further cardiac work-up recommended at this time  RTC as needed  Medication Adjustments/Labs and Tests Ordered: Current medicines are reviewed at length with the patient today.  Concerns regarding medicines are outlined above.  No orders of the defined types were placed in this encounter.  No orders of the defined types were placed in this encounter.   Patient Instructions  Medication Instructions:  Your physician recommends that you continue on your current medications as directed. Please refer to the Current Medication list given to you today.  Lab Work: NONE  Testing/Procedures: NONE  Follow-Up: At 04/11/17, you and your health needs are our priority.  As part of our continuing mission to provide you with exceptional heart care, we have created designated Provider Care Teams.  These Care Teams include your primary Cardiologist (physician) and Advanced Practice Providers (APPs -  Physician Assistants and Nurse Practitioners) who all work together to provide you with the care you need, when you need it.  We recommend signing up for the patient portal called "MyChart".  Sign up information is provided on this After Visit Summary.  MyChart is used to connect with patients for Virtual Visits (Telemedicine).  Patients are able to view lab/test  results, encounter notes, upcoming appointments, etc.  Non-urgent messages can be sent to your provider as well.   To learn more about what you can do with MyChart, go to [employer].    Your next appointment:   As needed with [email or web address]    Signed, Bjorn Pippin, MD  05/06/2019 5:43 PM    [facility]
